# Patient Record
Sex: Female | Born: 2000 | Race: White | Hispanic: No | Marital: Single | Smoking: Never smoker
Health system: Southern US, Community
[De-identification: ages and names within clinical notes are randomized; demographics above are authoritative.]

---
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
## 2018-06-14 ENCOUNTER — Emergency Department
Admission: EM | Admit: 2018-06-14 | Discharge: 2018-06-14 | Disposition: A | Discharge status: Home / Self Care | Payer: BLUE CROSS/BLUE SHIELD | Source: Home / Self Care

## 2018-06-14 ENCOUNTER — Encounter: Payer: Self-pay | Admitting: Emergency Medicine

## 2018-06-14 DIAGNOSIS — R0981 Nasal congestion: Secondary | ICD-10-CM | POA: Diagnosis not present

## 2018-06-14 DIAGNOSIS — R0982 Postnasal drip: Secondary | ICD-10-CM

## 2018-06-14 MED ORDER — IPRATROPIUM BROMIDE 0.06 % NA SOLN: 2.0000 | Freq: Four times a day (QID) | NASAL | 1 refills | Status: AC

## 2018-06-14 MED ORDER — CETIRIZINE HCL 10 MG PO TABS: 10.0000 mg | ORAL_TABLET | Freq: Every day | ORAL | 0 refills | Status: AC

## 2018-06-14 NOTE — ED Provider Notes (Signed)
[facility]    CSN: [id number] Arrival date & time: 06/14/18  1123     History   Chief Complaint Chief Complaint  Patient presents with  . Facial Pain    HPI Gina Chavez is a 18 y.o. female.   HPI  Gina Chavez is a 18 y.o. female presenting to UC with c/o 1 days of sinus congestion and post-nasal drip. Hx of sinus infections around the same time each year. Minimal HA.  Denies cough or chest pain. She took Claritin but no relief. Denies fever, chills, n/v/d.    History reviewed. No pertinent past medical history.  Patient Active Problem List   Diagnosis Date Noted  . FINGER SPRAIN 03/22/2009  . HAND INJURY 03/22/2009    History reviewed. No pertinent surgical history.  OB History   No obstetric history on file.      Home Medications    Prior to Admission medications   Medication Sig Start Date End Date Taking? Authorizing Provider  BLISOVI FE 1/20 1-20 MG-MCG tablet  03/19/18  Yes [provider]  sertraline (ZOLOFT) 50 MG tablet TAKE ONE TABLET (50 MG DOSE) BY MOUTH DAILY. 06/04/18  Yes [provider]  cetirizine (ZYRTEC) 10 MG tablet Take 1 tablet (10 mg total) by mouth daily. 06/14/18   Lurene Shadow, PA-C  ipratropium (ATROVENT) 0.06 % nasal spray Place 2 sprays into both nostrils 4 (four) times daily. 06/14/18   Lurene Shadow, PA-C    Family History Family History  Problem Relation Age of Onset  . Diabetes Other     Social History Social History   Tobacco Use  . Smoking status: Never Smoker  . Smokeless tobacco: Never Used  Substance Use Topics  . Alcohol use: No  . Drug use: Not on file     Allergies   Amoxicillin and Latex   Review of Systems Review of Systems  Constitutional: Negative for chills and fever.  HENT: Positive for congestion, postnasal drip and sore throat. Negative for ear pain, sinus pressure, sinus pain, trouble swallowing and voice change.   Respiratory: Negative for cough and  shortness of breath.   Cardiovascular: Negative for chest pain and palpitations.  Gastrointestinal: Negative for abdominal pain, diarrhea, nausea and vomiting.  Musculoskeletal: Negative for arthralgias, back pain and myalgias.  Skin: Negative for rash.  Neurological: Positive for headaches. Negative for dizziness and light-headedness.     Physical Exam Triage Vital Signs ED Triage Vitals  Enc Vitals Group     BP 06/14/18 1150 109/72     Pulse Rate 06/14/18 1150 93     Resp --      Temp 06/14/18 1150 98.4 F (36.9 C)     Temp Source 06/14/18 1150 Oral     SpO2 06/14/18 1150 97 %     Weight 06/14/18 1151 156 lb 4 oz (70.9 kg)     Height 06/14/18 1151 5\' 7"  (1.702 m)     Head Circumference --      Peak Flow --      Pain Score 06/14/18 1151 0     Pain Loc --      Pain Edu? --      Excl. in GC? --    No data found.  Updated Vital Signs BP 109/72 (BP Location: Right Arm)   Pulse 93   Temp 98.4 F (36.9 C) (Oral)   Ht 5\' 7"  (1.702 m)   Wt 156 lb 4 oz (70.9 kg)   LMP  05/24/2018   SpO2 97%   BMI 24.47 kg/m   Visual Acuity Right Eye Distance:   Left Eye Distance:   Bilateral Distance:    Right Eye Near:   Left Eye Near:    Bilateral Near:     Physical Exam Vitals signs and nursing note reviewed.  Constitutional:      Appearance: Normal appearance. She is well-developed.  HENT:     Head: Normocephalic and atraumatic.     Right Ear: Tympanic membrane normal.     Left Ear: Tympanic membrane normal.     Nose: Nose normal.     Right Sinus: No maxillary sinus tenderness or frontal sinus tenderness.     Left Sinus: No maxillary sinus tenderness or frontal sinus tenderness.     Mouth/Throat:     Lips: Pink.     Mouth: Mucous membranes are moist.     Pharynx: Oropharynx is clear. Uvula midline.  Neck:     Musculoskeletal: Normal range of motion.  Cardiovascular:     Rate and Rhythm: Normal rate and regular rhythm.  Pulmonary:     Effort: Pulmonary effort is  normal. No respiratory distress.     Breath sounds: Normal breath sounds. No stridor. No wheezing or rhonchi.  Musculoskeletal: Normal range of motion.  Skin:    General: Skin is warm and dry.  Neurological:     Mental Status: She is alert and oriented to person, place, and time.  Psychiatric:        Behavior: Behavior normal.      UC Treatments / Results  Labs (all labs ordered are listed, but only abnormal results are displayed) Labs Reviewed - No data to display  EKG None  Radiology No results found.  Procedures Procedures (including critical care time)  Medications Ordered in UC Medications - No data to display  Initial Impression / Assessment and Plan / UC Course  I have reviewed the triage vital signs and the nursing notes.  Pertinent labs & imaging results that were available during my care of the patient were reviewed by me and considered in my medical decision making (see chart for details).     Hx and exam c/w sinus headache with sinus congestion No evidence of underlying bacterial infection at this time Encouraged symptomatic tx  Final Clinical Impressions(s) / UC Diagnoses   Final diagnoses:  Sinus congestion  Post-nasal drip     Discharge Instructions      You may take 500mg  acetaminophen every 4-6 hours or in combination with ibuprofen 400-600mg  every 6-8 hours as needed for pain, inflammation, and fever.  Be sure to well hydrated with clear liquids and get at least 8 hours of sleep at night, preferably more while sick.   Please follow up with family medicine in 1 week if needed.     ED Prescriptions    Medication Sig Dispense Auth. Provider   ipratropium (ATROVENT) 0.06 % nasal spray Place 2 sprays into both nostrils 4 (four) times daily. 15 mL Doroteo Glassman, Martasia Talamante O, PA-C   cetirizine (ZYRTEC) 10 MG tablet Take 1 tablet (10 mg total) by mouth daily. 30 tablet Lurene Shadow, PA-C     Controlled Substance Prescriptions [place] Controlled Substance  Registry consulted? Not Applicable   Rolla Plate 06/14/18 1223

## 2018-06-14 NOTE — ED Triage Notes (Signed)
Patient c/o sinus infection x 1 day, chest congestion, drainage in throat, no cough, HA, no pain

## 2018-06-14 NOTE — Discharge Instructions (Signed)
  You may take 500mg acetaminophen every 4-6 hours or in combination with ibuprofen 400-600mg every 6-8 hours as needed for pain, inflammation, and fever.  Be sure to well hydrated with clear liquids and get at least 8 hours of sleep at night, preferably more while sick.   Please follow up with family medicine in 1 week if needed.
